# Patient Record
Sex: Male | Born: 1990 | Hispanic: Yes | Marital: Single | Smoking: Current every day smoker
Health system: Southern US, Community
[De-identification: ages and names within clinical notes are randomized; demographics above are authoritative.]

---

## 2015-10-18 ENCOUNTER — Encounter (HOSPITAL_COMMUNITY): Payer: Self-pay | Admitting: Emergency Medicine

## 2015-10-18 ENCOUNTER — Emergency Department (HOSPITAL_COMMUNITY)
Admission: EM | Admit: 2015-10-18 | Discharge: 2015-10-18 | Disposition: A | Discharge status: Home / Self Care | Payer: Self-pay | Attending: Emergency Medicine | Admitting: Emergency Medicine

## 2015-10-18 ENCOUNTER — Emergency Department (HOSPITAL_COMMUNITY): Payer: Self-pay

## 2015-10-18 DIAGNOSIS — R748 Abnormal levels of other serum enzymes: Secondary | ICD-10-CM | POA: Insufficient documentation

## 2015-10-18 DIAGNOSIS — M79603 Pain in arm, unspecified: Secondary | ICD-10-CM | POA: Insufficient documentation

## 2015-10-18 DIAGNOSIS — F17200 Nicotine dependence, unspecified, uncomplicated: Secondary | ICD-10-CM | POA: Insufficient documentation

## 2015-10-18 LAB — CBC WITH DIFFERENTIAL/PLATELET
BASOS ABS: 0 10*3/uL (ref 0.0–0.1)
BASOS PCT: 0 %
Eosinophils Absolute: 0.1 10*3/uL (ref 0.0–0.7)
Eosinophils Relative: 2 %
HEMATOCRIT: 46.6 % (ref 39.0–52.0)
HEMOGLOBIN: 15.4 g/dL (ref 13.0–17.0)
LYMPHS PCT: 40 %
Lymphs Abs: 2.3 10*3/uL (ref 0.7–4.0)
MCH: 29.8 pg (ref 26.0–34.0)
MCHC: 33 g/dL (ref 30.0–36.0)
MCV: 90.1 fL (ref 78.0–100.0)
MONO ABS: 0.4 10*3/uL (ref 0.1–1.0)
MONOS PCT: 7 %
NEUTROS ABS: 3 10*3/uL (ref 1.7–7.7)
NEUTROS PCT: 51 %
Platelets: 212 10*3/uL (ref 150–400)
RBC: 5.17 MIL/uL (ref 4.22–5.81)
RDW: 12.6 % (ref 11.5–15.5)
WBC: 5.9 10*3/uL (ref 4.0–10.5)

## 2015-10-18 LAB — COMPREHENSIVE METABOLIC PANEL
ALBUMIN: 4.7 g/dL (ref 3.5–5.0)
ALK PHOS: 58 U/L (ref 38–126)
ALT: 200 U/L — ABNORMAL HIGH (ref 17–63)
AST: 93 U/L — AB (ref 15–41)
Anion gap: 9 (ref 5–15)
BILIRUBIN TOTAL: 1.2 mg/dL (ref 0.3–1.2)
BUN: 18 mg/dL (ref 6–20)
CO2: 27 mmol/L (ref 22–32)
Calcium: 9.6 mg/dL (ref 8.9–10.3)
Chloride: 105 mmol/L (ref 101–111)
Creatinine, Ser: 1.04 mg/dL (ref 0.61–1.24)
GFR calc Af Amer: >60 mL/min (ref >60)
GFR calc non Af Amer: >60 mL/min (ref >60)
GLUCOSE: 103 mg/dL — AB (ref 65–99)
POTASSIUM: 4.1 mmol/L (ref 3.5–5.1)
SODIUM: 141 mmol/L (ref 135–145)
TOTAL PROTEIN: 7.3 g/dL (ref 6.5–8.1)

## 2015-10-18 MED ORDER — NAPROXEN 500 MG PO TABS: 500.0000 mg | ORAL_TABLET | Freq: Two times a day (BID) | ORAL | Status: AC

## 2015-10-18 NOTE — ED Provider Notes (Signed)
CSN: [id number]     Arrival date & time 10/18/15  1640 History   First MD Initiated Contact with Patient 10/18/15 1654     Chief Complaint  Patient presents with  . Arm Pain     (Consider location/radiation/quality/duration/timing/severity/associated sxs/prior Treatment) Patient is a 24 y.o. male presenting with arm pain. The history is provided by the patient (The patient complains of pain running down both arms in his upper back. He does a lot of lifting at work.).  Arm Pain This is a new problem. The current episode started more than 2 days ago. The problem occurs daily. The problem has been rapidly improving. Pertinent negatives include no chest pain and no abdominal pain. Exacerbated by: Movement of arms and upper back. Nothing relieves the symptoms.    History reviewed. No pertinent past medical history. History reviewed. No pertinent past surgical history. No family history on file. Social History  Substance Use Topics  . Smoking status: Current Every Day Smoker  . Smokeless tobacco: None  . Alcohol Use: No    Review of Systems  Cardiovascular: Negative for chest pain.  Gastrointestinal: Negative for abdominal pain.      Allergies  Review of patient's allergies indicates no known allergies.  Home Medications   Prior to Admission medications   Medication Sig Start Date End Date Taking? Authorizing Provider  naproxen (NAPROSYN) 500 MG tablet Take 1 tablet (500 mg total) by mouth 2 (two) times daily. 10/18/15   Bethann BerkshireJoseph Nieshia Larmon, MD   BP 156/76 mmHg  Pulse 74  Temp(Src) 98.4 F (36.9 C) (Oral)  Resp 20  Ht 5\' 9"  (1.753 m)  SpO2 99% Physical Exam  Constitutional: He is oriented to person, place, and time. He appears well-developed.  HENT:  Head: Normocephalic.  Eyes: Conjunctivae and EOM are normal. No scleral icterus.  Neck: Neck supple. No thyromegaly present.  Cardiovascular: Normal rate and regular rhythm.  Exam reveals no gallop and no friction rub.   No  murmur heard. Pulmonary/Chest: No stridor. He has no wheezes. He has no rales. He exhibits no tenderness.  Abdominal: He exhibits no distension. There is no tenderness. There is no rebound.  Musculoskeletal: Normal range of motion. He exhibits no edema.  Lymphadenopathy:    He has no cervical adenopathy.  Neurological: He is oriented to person, place, and time. He exhibits normal muscle tone. Coordination normal.  Skin: No rash noted. No erythema.  Psychiatric: He has a normal mood and affect. His behavior is normal.    ED Course  Procedures (including critical care time) Labs Review Labs Reviewed  COMPREHENSIVE METABOLIC PANEL - Abnormal; Notable for the following:    Glucose, Bld 103 (*)    AST 93 (*)    ALT 200 (*)    All other components within normal limits  CBC WITH DIFFERENTIAL/PLATELET    Imaging Review Dg Cervical Spine Complete  10/18/2015  CLINICAL DATA:  Pain without trauma.  Numbness. EXAM: CERVICAL SPINE - COMPLETE 4+ VIEW COMPARISON:  None. FINDINGS: The lateral view images through the bottom of C7. Prevertebral soft tissues are within normal limits. Maintenance of vertebral body height and alignment. Intervertebral disc heights are maintained. Facets are well-aligned. Lateral masses and odontoid process partially obscured on open-mouth view. No gross abnormality. Cervical thoracic junction not well visualized. IMPRESSION: Limited evaluation of the C1/C2 and C7/T1 levels. Otherwise, normal cervical spine. Electronically Signed   By: Jeronimo GreavesKyle  Talbot M.D.   On: 10/18/2015 17:37   I have personally reviewed and  evaluated these images and lab results as part of my medical decision-making.   EKG Interpretation None      MDM   Final diagnoses:  Pain of upper extremity, unspecified laterality  Elevated liver enzymes    X-ray unremarkable labs show elevated liver enzymes. Patient denies alcohol abuse. Suspect discomfort in upper back and arms are musculoskeletal. Will  treat discomfort with Naprosyn and have patient follow-up for elevated liver studies    Bethann Berkshire, MD 10/18/15 [phone]

## 2015-10-18 NOTE — ED Notes (Addendum)
Pt c/o of bilateral arm pain and numbness since last night. Denies injury or fall. Pt's friend translated.

## 2015-10-18 NOTE — Discharge Instructions (Signed)
Follow up with [facility] for recheck of elevated liver studies

## 2017-05-29 IMAGING — DX DG CERVICAL SPINE COMPLETE 4+V
6 series · 6 of 6 positions shown · non-contrast
Comparison: None.

CLINICAL DATA: Pain without trauma.  Numbness.

EXAM:
CERVICAL SPINE - COMPLETE 4+ VIEW

[c-spine lat]
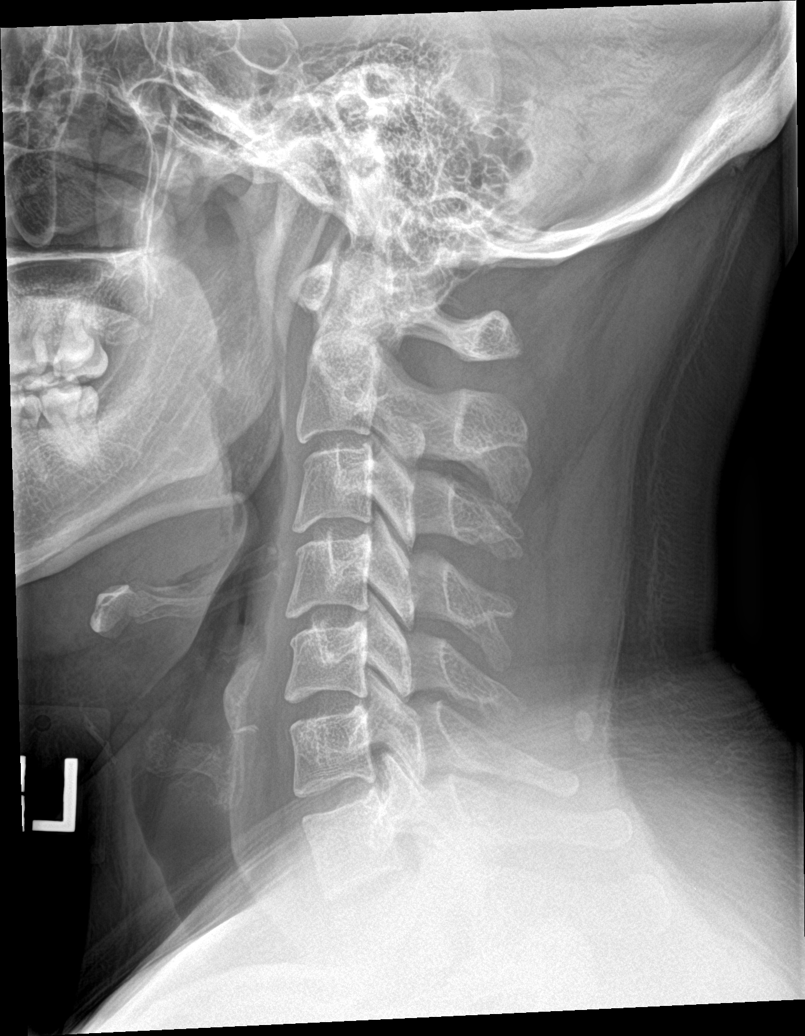

[c-spine obl (1 of 2)]
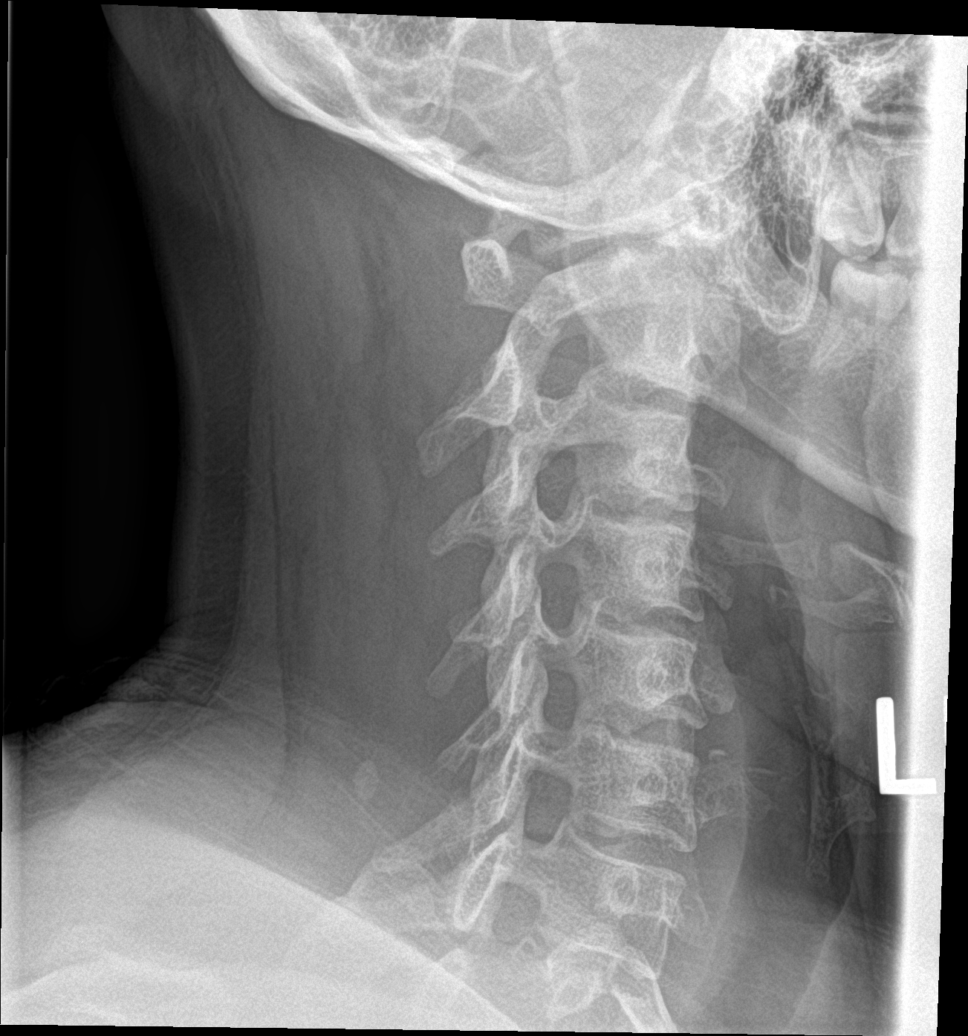

[c-spine obl (2 of 2)]
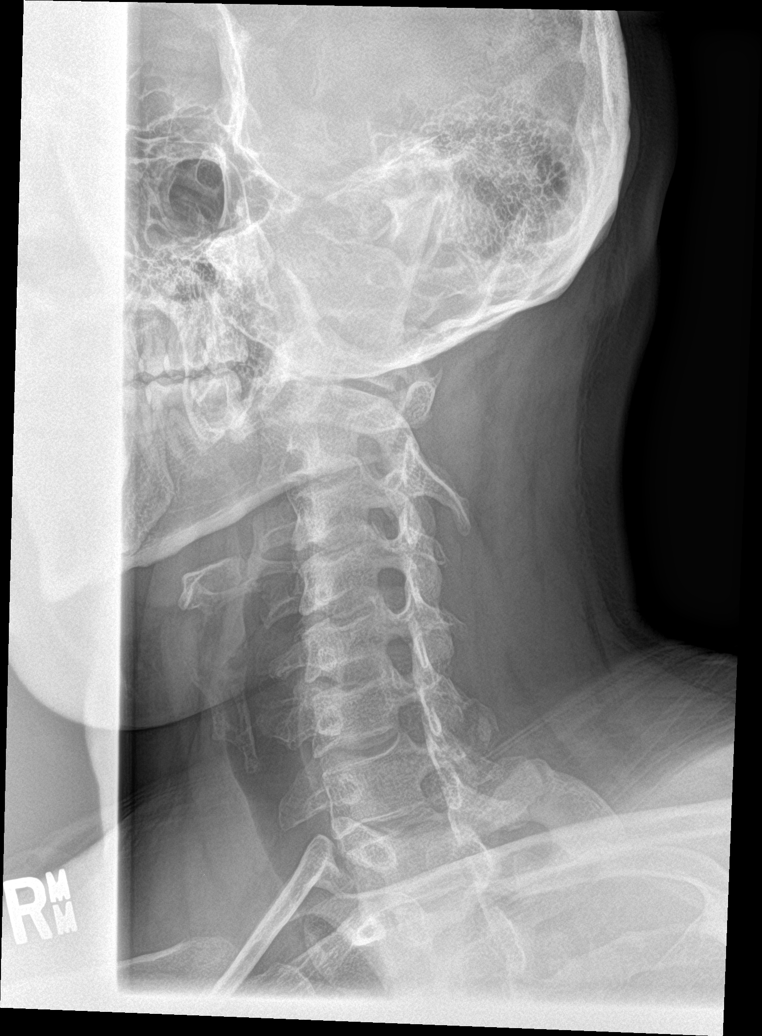

[c-spine ap]
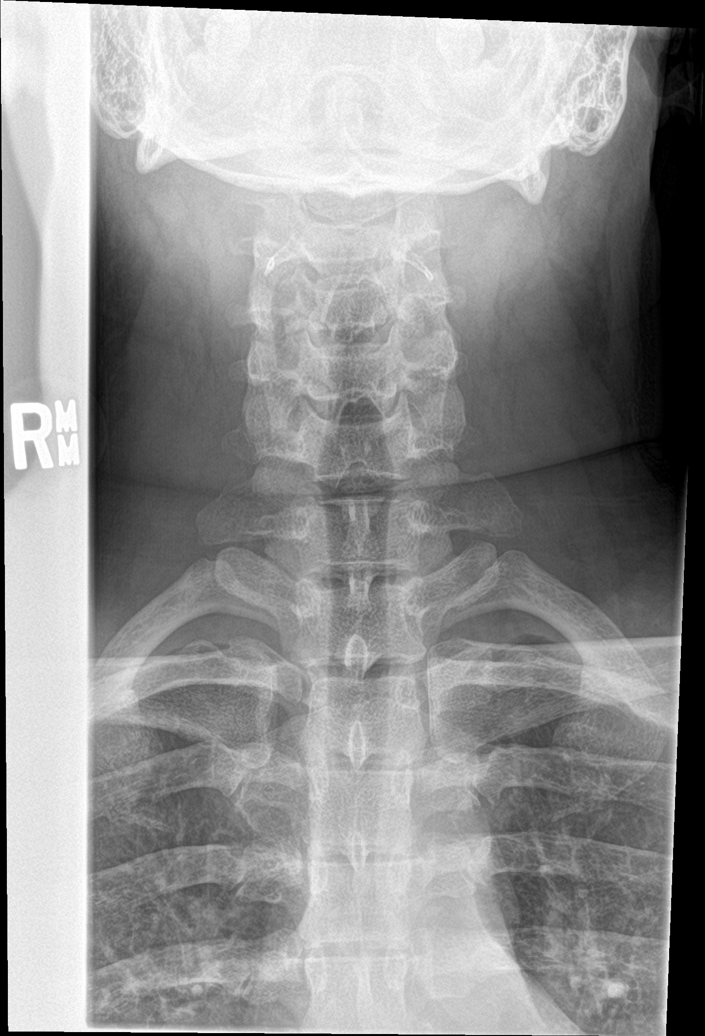

[c-spine open mouth]
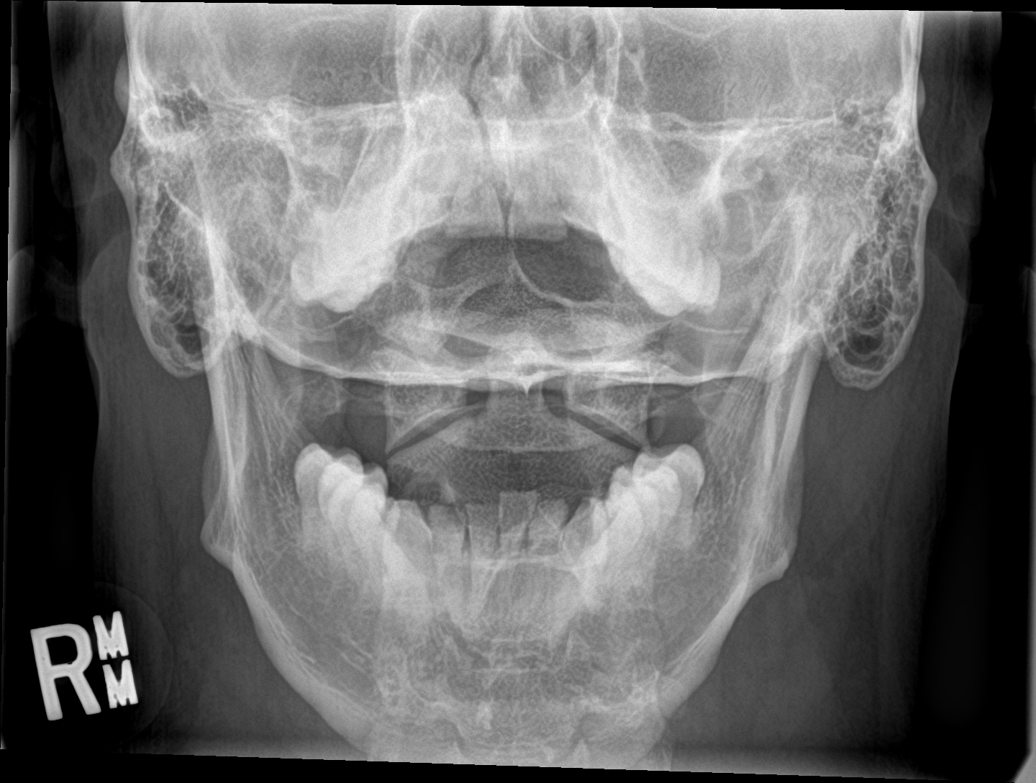

[[person_name]]
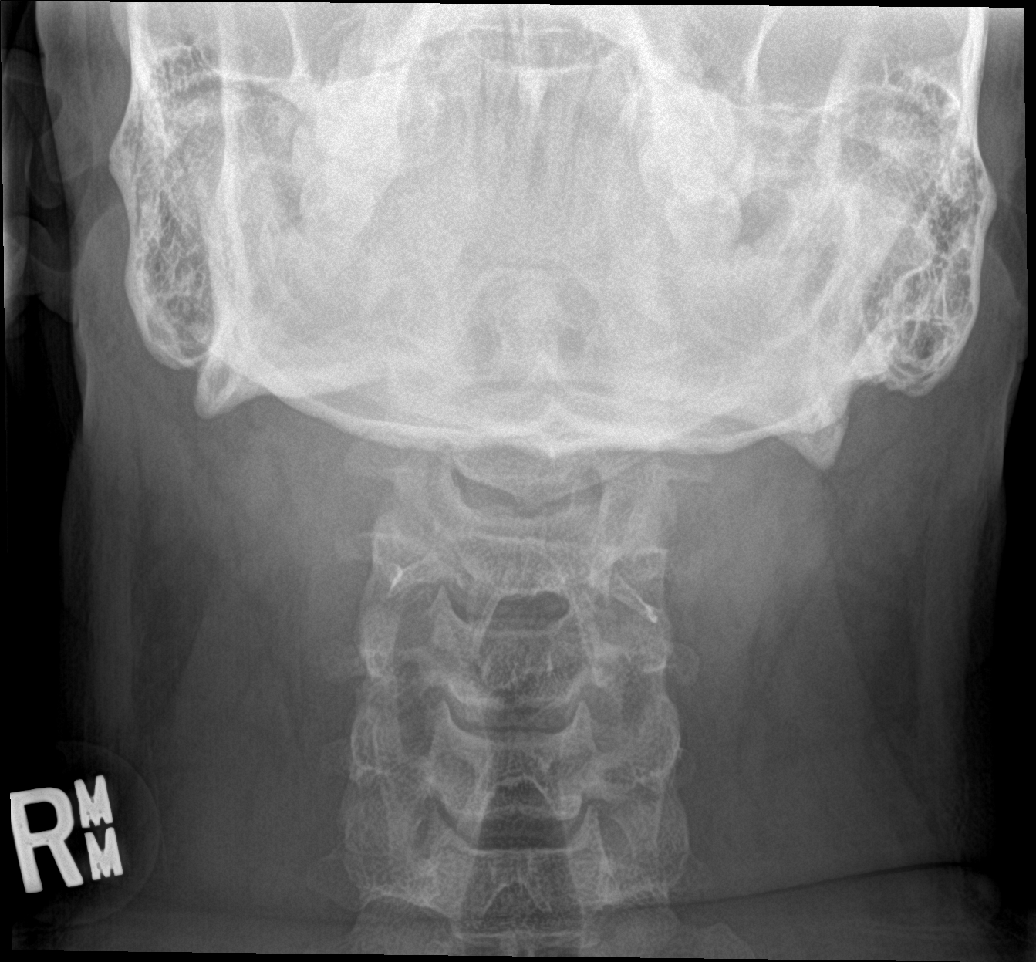

[6 of 6 positions shown; findings below may reference images not displayed]

FINDINGS: The lateral view images through the bottom of C7. Prevertebral soft
tissues are within normal limits. Maintenance of vertebral body
height and alignment. Intervertebral disc heights are maintained.
Facets are well-aligned. Lateral masses and odontoid process
partially obscured on open-mouth view. No gross abnormality.
Cervical thoracic junction not well visualized.
IMPRESSION: Limited evaluation of the C1/C2 and C7/T1 levels.

Otherwise, normal cervical spine.
